# Patient Record
Sex: Male | Born: 1995 | Race: White | Hispanic: No | Marital: Single | Smoking: Never smoker
Health system: Southern US, Community
[De-identification: ages and names within clinical notes are randomized; demographics above are authoritative.]

## PROBLEM LIST (undated history)

## (undated) HISTORY — PX: TONSILLECTOMY: SUR1361

## (undated) HISTORY — PX: HYDROCELE EXCISION / REPAIR: SUR1145

---

## 2019-02-12 ENCOUNTER — Ambulatory Visit: Payer: Self-pay | Attending: Internal Medicine

## 2019-02-12 DIAGNOSIS — Z20828 Contact with and (suspected) exposure to other viral communicable diseases: Secondary | ICD-10-CM | POA: Insufficient documentation

## 2019-02-12 DIAGNOSIS — Z20822 Contact with and (suspected) exposure to covid-19: Secondary | ICD-10-CM

## 2019-02-13 LAB — NOVEL CORONAVIRUS, NAA: SARS-CoV-2, NAA: NOT DETECTED

## 2019-03-26 ENCOUNTER — Ambulatory Visit (HOSPITAL_COMMUNITY)
Admission: EM | Admit: 2019-03-26 | Discharge: 2019-03-26 | Disposition: A | Discharge status: Home / Self Care | Payer: PRIVATE HEALTH INSURANCE | Attending: Urgent Care | Admitting: Urgent Care

## 2019-03-26 ENCOUNTER — Ambulatory Visit (INDEPENDENT_AMBULATORY_CARE_PROVIDER_SITE_OTHER): Payer: PRIVATE HEALTH INSURANCE

## 2019-03-26 ENCOUNTER — Encounter (HOSPITAL_COMMUNITY): Payer: Self-pay

## 2019-03-26 ENCOUNTER — Other Ambulatory Visit: Payer: Self-pay

## 2019-03-26 DIAGNOSIS — R112 Nausea with vomiting, unspecified: Secondary | ICD-10-CM | POA: Diagnosis not present

## 2019-03-26 DIAGNOSIS — R197 Diarrhea, unspecified: Secondary | ICD-10-CM

## 2019-03-26 DIAGNOSIS — K5900 Constipation, unspecified: Secondary | ICD-10-CM | POA: Diagnosis not present

## 2019-03-26 DIAGNOSIS — R519 Headache, unspecified: Secondary | ICD-10-CM

## 2019-03-26 LAB — POCT URINALYSIS DIP (DEVICE)
Glucose, UA: NEGATIVE mg/dL
Hgb urine dipstick: NEGATIVE
Ketones, ur: NEGATIVE mg/dL
Leukocytes,Ua: NEGATIVE
Nitrite: NEGATIVE
Protein, ur: NEGATIVE mg/dL
Specific Gravity, Urine: 1.025 (ref 1.005–1.030)
Urobilinogen, UA: 2 mg/dL — ABNORMAL HIGH (ref 0.0–1.0)
pH: 6.5 (ref 5.0–8.0)

## 2019-03-26 MED ORDER — OMEPRAZOLE 20 MG PO CPDR: 20.0000 mg | DELAYED_RELEASE_CAPSULE | Freq: Every day | ORAL | 0 refills | Status: AC

## 2019-03-26 MED ORDER — ONDANSETRON 4 MG PO TBDP
4.0000 mg | ORAL_TABLET | Freq: Once | ORAL | Status: AC
  Administered 2019-03-26: 4 mg via ORAL

## 2019-03-26 MED ORDER — ONDANSETRON 4 MG PO TBDP: 4.0000 mg | ORAL_TABLET | Freq: Three times a day (TID) | ORAL | 0 refills | Status: AC | PRN

## 2019-03-26 MED ORDER — ONDANSETRON 4 MG PO TBDP
ORAL_TABLET | ORAL | Status: AC
  Filled 2019-03-26: qty 1

## 2019-03-26 MED ORDER — DOCUSATE SODIUM 100 MG PO CAPS: 100.0000 mg | ORAL_CAPSULE | Freq: Two times a day (BID) | ORAL | 0 refills | Status: AC | PRN

## 2019-03-26 NOTE — ED Provider Notes (Signed)
[place]    CSN: [id number] Arrival date & time: 03/26/19  1155      History   Chief Complaint Chief Complaint  Patient presents with  . Emesis  . Headache    HPI Aaron Snow is a 24 y.o. male.   Aaron Snow presents with complaints of symptoms which started 3 days ago. Woke with chills, nausea, headache three nights ago. Vomiting and diarrhea two days ago. No further emesis or diarrhea since. Fatigue. Nausea since. No further chills. Low grade temp of up to 100 or under. Frequent belching. Loss of appetite. Feels better currently, headache has improved. Slight congestion. No abdominal pain. Feels full. "growling" after eating or drinking. Today hasn't eaten. Drank approximately 4-6 oz of water today. No known ill contacts. Denies any previous similar. No blood or black. No previous constipation. No out of state travel recently. Has tried "anti-gas" chews, didn't seem to help. Took ibuprofen for his headache, didn't have to take today. Decreased urine output. Was able to drink fluids yesterday, but feels like urine output was less. No pain with urination. Negative rapid covid yesterday, pcr testing still pending. Doesn't work outside the home.      History reviewed. No pertinent past medical history.  There are no problems to display for this patient.   Past Surgical History:  Procedure Laterality Date  . HYDROCELE EXCISION / REPAIR    . TONSILLECTOMY         Home Medications    Prior to Admission medications   Medication Sig Start Date End Date Taking? Authorizing Provider  docusate sodium (COLACE) 100 MG capsule Take 1-2 capsules (100-200 mg total) by mouth 2 (two) times daily as needed for mild constipation. 03/26/19   Zigmund Gottron, NP  omeprazole (PRILOSEC) 20 MG capsule Take 1 capsule (20 mg total) by mouth daily. 03/26/19   Zigmund Gottron, NP  ondansetron (ZOFRAN-ODT) 4 MG disintegrating tablet Take 1 tablet (4 mg total) by mouth  every 8 (eight) hours as needed for nausea or vomiting. 03/26/19   Zigmund Gottron, NP    Family History Family History  Problem Relation Age of Onset  . Healthy Mother   . Healthy Father     Social History Social History   Tobacco Use  . Smoking status: Never Smoker  . Smokeless tobacco: Never Used  Substance Use Topics  . Alcohol use: Yes  . Drug use: Never     Allergies   Patient has no known allergies.   Review of Systems Review of Systems   Physical Exam Triage Vital Signs ED Triage Vitals  Enc Vitals Group     BP 03/26/19 1237 113/71     Pulse Rate 03/26/19 1237 72     Resp 03/26/19 1237 16     Temp 03/26/19 1237 99.1 F (37.3 C)     Temp Source 03/26/19 1237 Oral     SpO2 03/26/19 1237 100 %     Weight --      Height --      Head Circumference --      Peak Flow --      Pain Score 03/26/19 1233 0     Pain Loc --      Pain Edu? --      Excl. in [place]? --    No data found.  Updated Vital Signs BP 113/71 (BP Location: Left Arm)   Pulse 72   Temp 99.1 F (37.3 C) (Oral)  Resp 16   SpO2 100%    Physical Exam Constitutional:      Appearance: He is well-developed.  Cardiovascular:     Rate and Rhythm: Normal rate.  Pulmonary:     Effort: Pulmonary effort is normal.  Abdominal:     Tenderness: There is abdominal tenderness in the epigastric area and periumbilical area. There is no right CVA tenderness, left CVA tenderness, guarding or rebound.  Skin:    General: Skin is warm and dry.  Neurological:     Mental Status: He is alert and oriented to person, place, and time.      UC Treatments / Results  Labs (all labs ordered are listed, but only abnormal results are displayed) Labs Reviewed  POCT URINALYSIS DIP (DEVICE) - Abnormal; Notable for the following components:      Result Value   Bilirubin Urine SMALL (*)    Urobilinogen, UA 2.0 (*)    All other components within normal limits    EKG   Radiology DG Abd 1 View  Result  Date: 03/26/2019 CLINICAL DATA:  Nausea, vomiting, loss of appetite EXAM: ABDOMEN - 1 VIEW COMPARISON:  None. FINDINGS: Normal bowel gas pattern. Mild stool burden within right colon and rectosigmoid. No significant osseous abnormality. No radiopaque calculi. IMPRESSION: Normal bowel gas pattern. Electronically Signed   By: Guadlupe Spanish M.D.   On: 03/26/2019 13:47    Procedures Procedures (including critical care time)  Medications Ordered in UC Medications  ondansetron (ZOFRAN-ODT) disintegrating tablet 4 mg (4 mg Oral Given 03/26/19 1248)    Initial Impression / Assessment and Plan / UC Course  I have reviewed the triage vital signs and the nursing notes.  Pertinent labs & imaging results that were available during my care of the patient were reviewed by me and considered in my medical decision making (see chart for details).     Very mild non specific abdominal discomfort on palpation. Vitals stable. No further emesis or diarrhea. "fullness" sensation with mild constipation noted on xray. Tolerating sips of liquids in clinic. Encouraged use of colace to promote bowel movement. Increased sips. Prn use of zofran. Already has covid testing pending. Return precautions provided. Patient verbalized understanding and agreeable to plan.   Final Clinical Impressions(s) / UC Diagnoses   Final diagnoses:  Nausea vomiting and diarrhea  Bad headache     Discharge Instructions     Your xray has mild stool burden. This may be somewhat contributing to your symptoms.  Zofran every 8 hours as needed for nausea or vomiting.   Colace 1-2 times a day to promote regular bowel movements. If stools become loose again hold off.  Small frequent sips of fluids- Pedialyte, Gatorade, water, broth- to maintain hydration.   Increase your fluid intake.  Bland diet as tolerated.  If develop worsening of symptoms, dizziness, abdominal pain, worsening of fevers, or otherwise worsening please return or go to the  Er.     ED Prescriptions    Medication Sig Dispense Auth. Provider   ondansetron (ZOFRAN-ODT) 4 MG disintegrating tablet Take 1 tablet (4 mg total) by mouth every 8 (eight) hours as needed for nausea or vomiting. 12 tablet Linus Mako B, NP   docusate sodium (COLACE) 100 MG capsule Take 1-2 capsules (100-200 mg total) by mouth 2 (two) times daily as needed for mild constipation. 60 capsule Linus Mako B, NP   omeprazole (PRILOSEC) 20 MG capsule Take 1 capsule (20 mg total) by mouth daily. 30 capsule Linus Mako  B, NP     PDMP not reviewed this encounter.   Georgetta Haber, NP 03/26/19 [phone]

## 2019-03-26 NOTE — Discharge Instructions (Signed)
Your xray has mild stool burden. This may be somewhat contributing to your symptoms.  Zofran every 8 hours as needed for nausea or vomiting.   Colace 1-2 times a day to promote regular bowel movements. If stools become loose again hold off.  Small frequent sips of fluids- Pedialyte, Gatorade, water, broth- to maintain hydration.   Increase your fluid intake.  Bland diet as tolerated.  If develop worsening of symptoms, dizziness, abdominal pain, worsening of fevers, or otherwise worsening please return or go to the Er.

## 2019-03-26 NOTE — ED Triage Notes (Signed)
Pt c/o HA, n/v/d, chills, fatigue three days ago. Vomiting and diarrhea have resolved but nausea continues and has decreased po tolerance, decrease fluid intake per pt but tolerated 5-6 glasses yesterday; "feels dehydrated". Here for tx of nausea and eval of hydration status.  States had fever approx 101 yesterday.  Denies: SOB, CP, or abd pain.  Had rapid COVID test (negative) yesterday and a PCR test at a community testing site ([facility])-and still awaiting final results of send out lab.

## 2019-05-20 ENCOUNTER — Ambulatory Visit: Payer: PRIVATE HEALTH INSURANCE | Admitting: Physician Assistant

## 2021-04-27 ENCOUNTER — Other Ambulatory Visit: Payer: Self-pay

## 2021-04-27 ENCOUNTER — Ambulatory Visit (INDEPENDENT_AMBULATORY_CARE_PROVIDER_SITE_OTHER): Payer: BC Managed Care – PPO

## 2021-04-27 ENCOUNTER — Ambulatory Visit (HOSPITAL_COMMUNITY)
Admission: EM | Admit: 2021-04-27 | Discharge: 2021-04-27 | Disposition: A | Discharge status: Home / Self Care | Payer: BC Managed Care – PPO | Attending: Nurse Practitioner | Admitting: Nurse Practitioner

## 2021-04-27 ENCOUNTER — Encounter (HOSPITAL_COMMUNITY): Payer: Self-pay

## 2021-04-27 DIAGNOSIS — R197 Diarrhea, unspecified: Secondary | ICD-10-CM

## 2021-04-27 LAB — POCT URINALYSIS DIPSTICK, ED / UC
Bilirubin Urine: NEGATIVE
Glucose, UA: NEGATIVE mg/dL
Hgb urine dipstick: NEGATIVE
Ketones, ur: NEGATIVE mg/dL
Leukocytes,Ua: NEGATIVE
Nitrite: NEGATIVE
Protein, ur: NEGATIVE mg/dL
Specific Gravity, Urine: 1.01 (ref 1.005–1.030)
Urobilinogen, UA: 0.2 mg/dL (ref 0.0–1.0)
pH: 6 (ref 5.0–8.0)

## 2021-04-27 MED ORDER — DIPHENOXYLATE-ATROPINE 2.5-0.025 MG PO TABS: 1.0000 | ORAL_TABLET | Freq: Four times a day (QID) | ORAL | 0 refills | Status: AC | PRN

## 2021-04-27 NOTE — ED Triage Notes (Signed)
Patient presents to Urgent Care with complaints of diarrhea x 5 days ago. He states he has been having about 5-6 episodes a day. Pt states traveling, taking 2 doses of imodium, pepto, tylenol.  ? ?Denies fever.  ?

## 2021-04-27 NOTE — Discharge Instructions (Addendum)
Take medication as prescribed. ?Drink plenty of fluids, preferably water, avoid drinks that have a high amount of sugar, such as Gatorade or fruit juice. ?If stool occurrence increases, decrease the amount of fibrous foods. ?Follow-up with your primary care if symptoms worsen or do not improve for possible GI referral. ?

## 2021-04-27 NOTE — ED Provider Notes (Signed)
?[facility] ? ? ? ?CSN: [id number] ?Arrival date & time: 04/27/21  6073 ? ? ?  ? ?History   ?Chief Complaint ?Chief Complaint  ?Patient presents with  ? Diarrhea  ? ? ?HPI ?Aaron Snow is a 26 y.o. male.  ? ?The patient is a 26 year old male who presents with gastrointestinal symptoms x 5 days.  Symptoms include diarrhea, and nausea.  He reports that he has had waxing and waning of the diarrhea.  He reports that he had 5-6 loose stools last night.  He has been taking Imodium, Pepto and Tylenol, with some improvement of his symptoms.  He admits that he was recently out of town and was concerned that he may have food poisoning.  He informs that this happened to him a couple years ago and he was diagnosed with constipation.  The diarrhea has been greenish-brown, with no blood present.  He denies fever, chills, abdominal pain, vomiting, urinary symptoms, URI symptoms.  He reports that he does have increased diarrhea when he drinks a lot.  His appetite has been decreased since the onset of symptoms. Denies history of GERD, crohn's, IBS.  Patient does report a family history of colon polyps in his father and paternal grandfather. ? ? ?Diarrhea ?Associated symptoms: no fever and no vomiting   ? ?History reviewed. No pertinent past medical history. ? ?There are no problems to display for this patient. ? ? ?Past Surgical History:  ?Procedure Laterality Date  ? HYDROCELE EXCISION / REPAIR    ? TONSILLECTOMY    ? ? ? ? ? ?Home Medications   ? ?Prior to Admission medications   ?Medication Sig Start Date End Date Taking? Authorizing Provider  ?docusate sodium (COLACE) 100 MG capsule Take 1-2 capsules (100-200 mg total) by mouth 2 (two) times daily as needed for mild constipation. 03/26/19   Georgetta Haber, NP  ?omeprazole (PRILOSEC) 20 MG capsule Take 1 capsule (20 mg total) by mouth daily. 03/26/19   Georgetta Haber, NP  ?ondansetron (ZOFRAN-ODT) 4 MG disintegrating tablet Take 1 tablet (4 mg total) by mouth  every 8 (eight) hours as needed for nausea or vomiting. 03/26/19   Georgetta Haber, NP  ? ? ?Family History ?Family History  ?Problem Relation Age of Onset  ? Healthy Mother   ? Healthy Father   ? ? ?Social History ?Social History  ? ?Tobacco Use  ? Smoking status: Never  ? Smokeless tobacco: Never  ?Vaping Use  ? Vaping Use: Never used  ?Substance Use Topics  ? Alcohol use: Yes  ? Drug use: Never  ? ? ? ?Allergies   ?Patient has no known allergies. ? ? ?Review of Systems ?Review of Systems  ?Constitutional:  Positive for fatigue. Negative for fever.  ?HENT: Negative.    ?Respiratory: Negative.    ?Cardiovascular: Negative.   ?Gastrointestinal:  Positive for diarrhea and nausea. Negative for rectal pain and vomiting.  ?Genitourinary: Negative.   ?Skin: Negative.   ?Psychiatric/Behavioral: Negative.    ? ? ?Physical Exam ?Triage Vital Signs ?ED Triage Vitals  ?Enc Vitals Group  ?   BP 04/27/21 0909 108/76  ?   Pulse Rate 04/27/21 0909 70  ?   Resp 04/27/21 0909 16  ?   Temp 04/27/21 0909 99.3 ?F (37.4 ?C)  ?   Temp Source 04/27/21 0909 Oral  ?   SpO2 04/27/21 0909 97 %  ?   Weight --   ?   Height --   ?  Head Circumference --   ?   Peak Flow --   ?   Pain Score 04/27/21 0906 0  ?   Pain Loc --   ?   Pain Edu? --   ?   Excl. in [place]? --   ? ?No data found. ? ?Updated Vital Signs ?BP 108/76 (BP Location: Left Arm)   Pulse 70   Temp 99.3 ?F (37.4 ?C) (Oral)   Resp 16   SpO2 97%  ? ?Visual Acuity ?Right Eye Distance:   ?Left Eye Distance:   ?Bilateral Distance:   ? ?Right Eye Near:   ?Left Eye Near:    ?Bilateral Near:    ? ?Physical Exam ?Constitutional:   ?   Appearance: Normal appearance. He is normal weight.  ?HENT:  ?   Head: Normocephalic and atraumatic.  ?   Nose: Nose normal.  ?Cardiovascular:  ?   Rate and Rhythm: Normal rate and regular rhythm.  ?Pulmonary:  ?   Effort: Pulmonary effort is normal.  ?   Breath sounds: Normal breath sounds.  ?Abdominal:  ?   General: Bowel sounds are normal. There is no  distension.  ?   Palpations: Abdomen is soft.  ?   Tenderness: There is abdominal tenderness (diffuse abdominal tenderness, increased on the LUQ/LLQ). There is no right CVA tenderness, left CVA tenderness, guarding or rebound.  ?Musculoskeletal:  ?   Cervical back: Normal range of motion and neck supple.  ?Neurological:  ?   General: No focal deficit present.  ?   Mental Status: He is alert and oriented to person, place, and time.  ?Psychiatric:     ?   Mood and Affect: Mood normal.     ?   Behavior: Behavior normal.     ?   Thought Content: Thought content normal.  ? ? ? ?UC Treatments / Results  ?Labs ?(all labs ordered are listed, but only abnormal results are displayed) ?Labs Reviewed  ?POCT URINALYSIS DIPSTICK, ED / UC  ? ? ?EKG ? ? ?Radiology ?No results found. ? ?Procedures ?Procedures (including critical care time) ? ?Medications Ordered in UC ?Medications - No data to display ? ?Initial Impression / Assessment and Plan / UC Course  ?I have reviewed the triage vital signs and the nursing notes. ? ?Pertinent labs & imaging results that were available during my care of the patient were reviewed by me and considered in my medical decision making (see chart for details). ? ?Symptoms are consistent with viral gastroenteritis.  Patient does not have fever, vomiting, or severe abdominal pain, there is no concern for acute abdomen at this time.  X-rays are negative for constipation, urinalysis is normal.  Will discuss with patient that symptoms are most likely viral.  We will have patient continue to hydrate, we will also prescribe Lomotil to help with symptoms.  Recommend avoiding foods that may aggravate symptoms.  If symptoms worsen or do not improve, recommend he follow-up with his primary care for possible GI referral. ? ?Final Clinical Impressions(s) / UC Diagnoses  ? ?Final diagnoses:  ?None  ? ?Discharge Instructions   ?None ?  ? ?ED Prescriptions   ?None ?  ? ?PDMP not reviewed this encounter. ?   ?Abran Cantor, NP ?04/27/21 1026 ? ?

## 2022-09-17 ENCOUNTER — Encounter (HOSPITAL_COMMUNITY): Payer: Self-pay

## 2022-09-17 ENCOUNTER — Emergency Department (HOSPITAL_COMMUNITY)
Admission: EM | Admit: 2022-09-17 | Discharge: 2022-09-17 | Disposition: A | Discharge status: Home / Self Care | Payer: BC Managed Care – PPO | Attending: Emergency Medicine | Admitting: Emergency Medicine

## 2022-09-17 ENCOUNTER — Other Ambulatory Visit: Payer: Self-pay

## 2022-09-17 DIAGNOSIS — W268XXA Contact with other sharp object(s), not elsewhere classified, initial encounter: Secondary | ICD-10-CM | POA: Diagnosis not present

## 2022-09-17 DIAGNOSIS — S81811A Laceration without foreign body, right lower leg, initial encounter: Secondary | ICD-10-CM | POA: Diagnosis present

## 2022-09-17 MED ORDER — LIDOCAINE-EPINEPHRINE (PF) 2 %-1:200000 IJ SOLN
10.0000 mL | Freq: Once | INTRAMUSCULAR | Status: AC
  Administered 2022-09-17: 10 mL
  Filled 2022-09-17: qty 20

## 2022-09-17 NOTE — ED Notes (Signed)
Pt states laceration to right ankle from a piece of metal. Lac bandaged and bleeding controlled at this time.

## 2022-09-17 NOTE — Discharge Instructions (Addendum)
Follow-up with your PCP in 7 to 10 days for wound recheck and suture removal.  Okay to clean the wound with warm soapy water every day, watch for signs of infection which include redness, fever, worsening swelling and pain, purulent drainage from around the wound.

## 2022-09-17 NOTE — ED Provider Notes (Signed)
[place] EMERGENCY DEPARTMENT AT [facility] Provider Note   CSN: [id number] Arrival date & time: 09/17/22  1553     History  Chief Complaint  Patient presents with   Extremity Laceration    Aaron Snow is a 27 y.o. male.  HPI   27 year old male presenting to the emergency department with a chief complaint of laceration.  The patient sustained a 2 cm laceration to the medial right lower extremity on a piece of metal earlier today.  The metal piece fell and lacerated his right leg.  His tetanus is up-to-date.  Bleeding is controlled.  He denies any other injuries or complaints.  Home Medications Prior to Admission medications   Medication Sig Start Date End Date Taking? Authorizing Provider  diphenoxylate-atropine (LOMOTIL) 2.5-0.025 MG tablet Take 1 tablet by mouth 4 (four) times daily as needed for diarrhea or loose stools. 04/27/21   Zenia Resides, MD  docusate sodium (COLACE) 100 MG capsule Take 1-2 capsules (100-200 mg total) by mouth 2 (two) times daily as needed for mild constipation. 03/26/19   Georgetta Haber, NP  omeprazole (PRILOSEC) 20 MG capsule Take 1 capsule (20 mg total) by mouth daily. 03/26/19   Georgetta Haber, NP  ondansetron (ZOFRAN-ODT) 4 MG disintegrating tablet Take 1 tablet (4 mg total) by mouth every 8 (eight) hours as needed for nausea or vomiting. 03/26/19   Georgetta Haber, NP      Allergies    Patient has no known allergies.    Review of Systems   Review of Systems  All other systems reviewed and are negative.   Physical Exam Updated Vital Signs BP 121/70 (BP Location: Right Arm)   Pulse 71   Temp 98.7 F (37.1 C) (Oral)   Resp 12   Ht 5\' 6"  (1.676 m)   Wt 66.7 kg   SpO2 100%   BMI 23.73 kg/m  Physical Exam Vitals and nursing note reviewed.  Constitutional:      General: He is not in acute distress. HENT:     Head: Normocephalic and atraumatic.  Eyes:     Conjunctiva/sclera: Conjunctivae normal.     Pupils:  Pupils are equal, round, and reactive to light.  Cardiovascular:     Rate and Rhythm: Normal rate and regular rhythm.  Pulmonary:     Effort: Pulmonary effort is normal. No respiratory distress.  Abdominal:     General: There is no distension.     Tenderness: There is no guarding.  Musculoskeletal:        General: No deformity or signs of injury.     Cervical back: Neck supple.     Comments: 2cm laceration along the medial right lower extremity, proximal to the ankle, no tenderness of the medial or lateral malleolus of the ankle, neurovascularly intact.  Skin:    Findings: No lesion or rash.  Neurological:     General: No focal deficit present.     Mental Status: He is alert. Mental status is at baseline.     ED Results / Procedures / Treatments   Labs (all labs ordered are listed, but only abnormal results are displayed) Labs Reviewed - No data to display  EKG None  Radiology No results found.  Procedures .Marland KitchenLaceration Repair  Date/Time: 09/17/2022 8:06 PM  Performed by: Ernie Avena, MD Authorized by: Ernie Avena, MD   Consent:    Consent obtained:  Verbal   Consent given by:  Patient   Risks discussed:  Infection, pain and poor cosmetic result Universal protocol:    Patient identity confirmed:  Verbally with patient Anesthesia:    Anesthesia method:  Local infiltration   Local anesthetic:  Lidocaine 1% WITH epi Laceration details:    Location:  Leg   Leg location:  R lower leg   Length (cm):  2 Treatment:    Area cleansed with:  Saline   Amount of cleaning:  Standard Skin repair:    Repair method:  Sutures   Suture size:  3-0   Suture material:  Prolene   Suture technique:  Simple interrupted   Number of sutures:  3 Approximation:    Approximation:  Close Repair type:    Repair type:  Simple Post-procedure details:    Dressing:  Antibiotic ointment and bulky dressing   Procedure completion:  Tolerated     Medications Ordered in  ED Medications  lidocaine-EPINEPHrine (XYLOCAINE W/EPI) 2 %-1:200000 (PF) injection 10 mL (10 mLs Infiltration Given 09/17/22 1942)    ED Course/ Medical Decision Making/ A&P                             Medical Decision Making Risk Prescription drug management.    27 year old male presenting to the emergency department with a chief complaint of laceration.  The patient sustained a 2 cm laceration to the medial right lower extremity on a piece of metal earlier today.  The metal piece fell and lacerated his right leg.  His tetanus is up-to-date.  Bleeding is controlled.  He denies any other injuries or complaints.  On arrival, the patient was vitally stable.  2 cm laceration to the medial right leg above the ankle joint noted as per above.  Irrigated by Education administrator.  Bleeding controlled, tetanus is up-to-date.  3 sutures were placed as per the procedure note above.  The patient was given bacitracin ointment and the wound was wrapped.  He was advised wound care instructions, return precautions and follow-up for suture removal.   Final Clinical Impression(s) / ED Diagnoses Final diagnoses:  Laceration of right lower extremity, initial encounter    Rx / DC Orders ED Discharge Orders     None         Ernie Avena, MD 09/17/22 2100

## 2022-09-17 NOTE — ED Triage Notes (Signed)
Pt states he was cutting a piece of metal and the piece fell and lacerated his right ankle. Pt has approx 1.5 in lac on inner right ankle. Bleeding is controlled.

## 2023-01-03 IMAGING — DX DG ABDOMEN 1V
1 series · 1 of 1 positions shown · non-contrast
Comparison: 03/26/2019

CLINICAL DATA: Diarrhea.  History of constipation.

EXAM:
ABDOMEN - 1 VIEW

[abdomen kub]
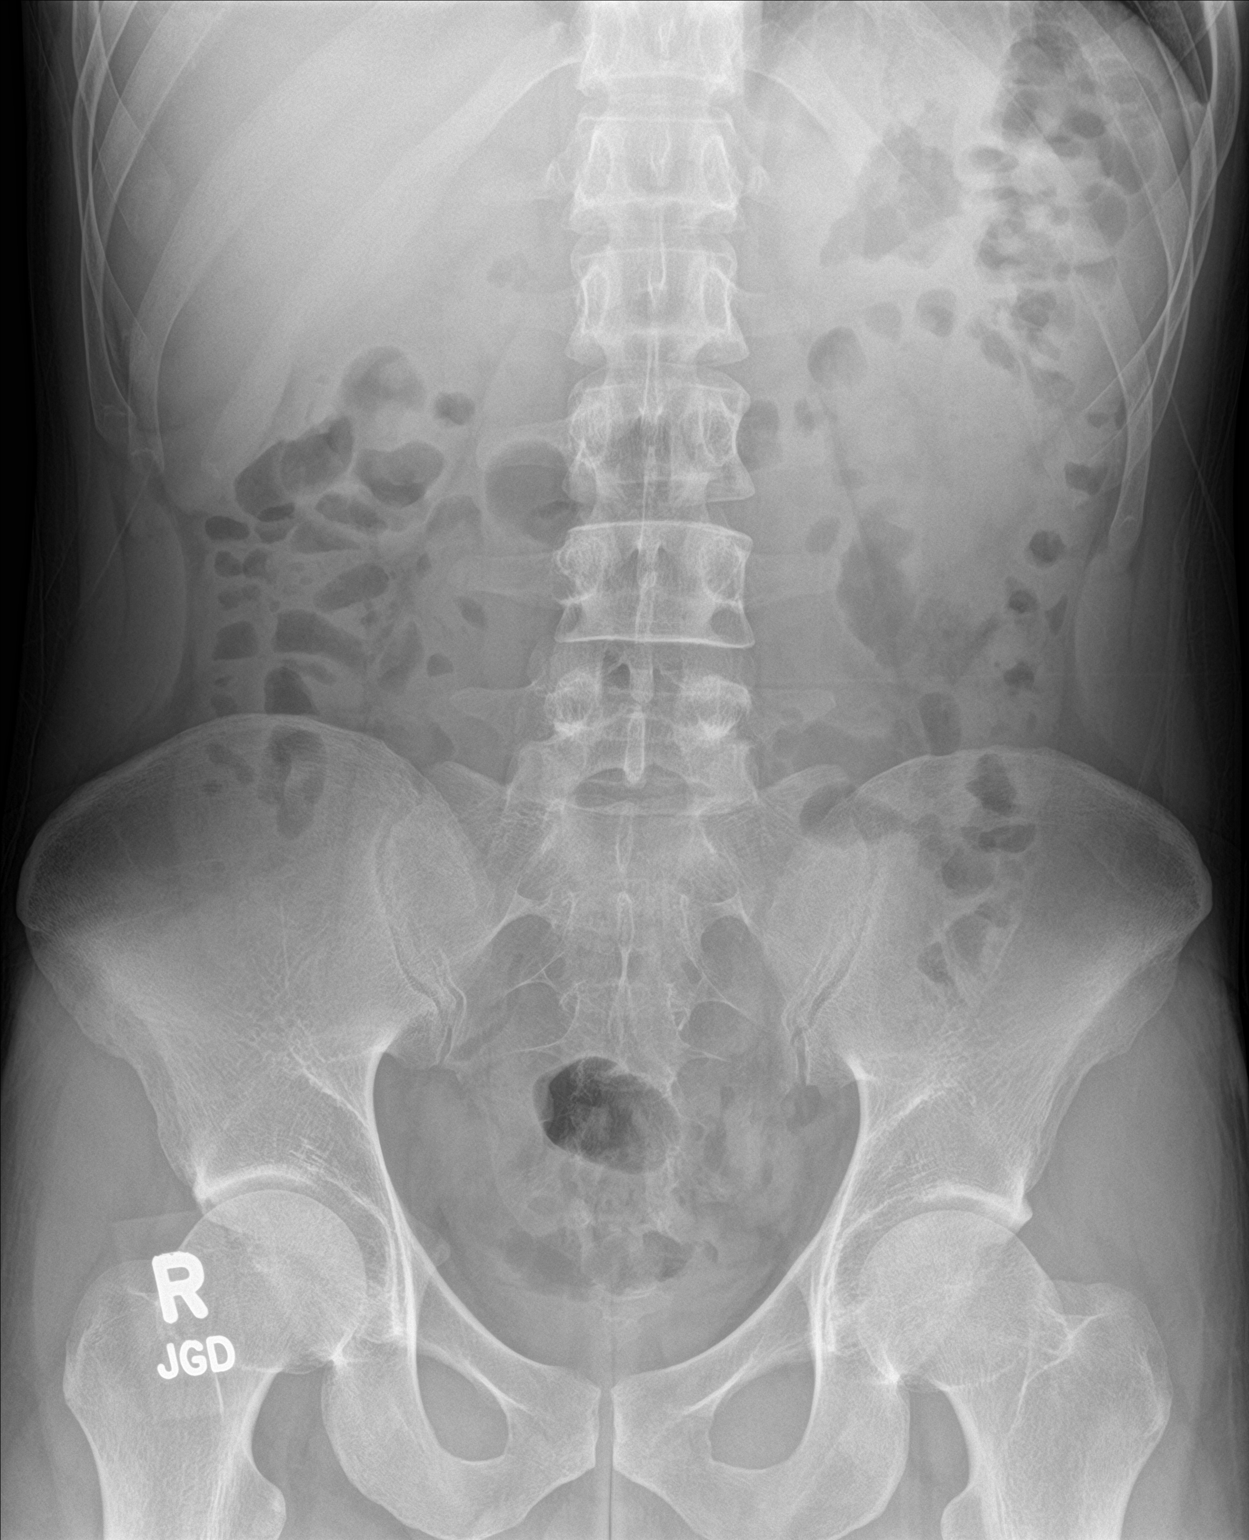

[1 of 1 positions shown; findings below may reference images not displayed]

FINDINGS: Only minimal stool is seen within the sigmoid colon. Overall stool
burden is decreased from prior. Air is seen within nondilated loops
of small and large bowel. No portal venous gas or pneumatosis is
seen although the superior aspect of the liver is excluded from
view. No acute skeletal abnormality.
IMPRESSION: Minimal stool burden.  Nonobstructed bowel-gas pattern.

## 2023-02-17 ENCOUNTER — Ambulatory Visit (HOSPITAL_COMMUNITY): Payer: Self-pay
